# Patient Record
Sex: Male | Born: 1995
Health system: Southern US, Community
[De-identification: ages and names within clinical notes are randomized; demographics above are authoritative.]

## PROBLEM LIST (undated history)

## (undated) DIAGNOSIS — S060XAA Concussion with loss of consciousness status unknown, initial encounter: Secondary | ICD-10-CM

## (undated) DIAGNOSIS — S060X9A Concussion with loss of consciousness of unspecified duration, initial encounter: Secondary | ICD-10-CM

---
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
## 2014-06-19 ENCOUNTER — Ambulatory Visit
Admit: 2014-06-19 | Disposition: A | Discharge status: Home / Self Care | Payer: Self-pay | Attending: Family Medicine | Admitting: Family Medicine

## 2014-08-15 ENCOUNTER — Telehealth: Payer: Self-pay | Admitting: Family Medicine

## 2014-08-15 NOTE — Telephone Encounter (Signed)
No room today. If he is in severe pain may wish to go to Urgent Care tonight

## 2014-08-15 NOTE — Telephone Encounter (Signed)
Just now seeing message please advise

## 2014-08-15 NOTE — Telephone Encounter (Signed)
Pt stated he is still having serve head pain and when he gets hot he sometimes has blurred vision. Pt is scheduled to come in tomorrow but would like to see if he could be worked in today if possible. Thanks TNP

## 2014-08-16 ENCOUNTER — Encounter: Payer: Self-pay | Admitting: Family Medicine

## 2014-08-16 ENCOUNTER — Ambulatory Visit (INDEPENDENT_AMBULATORY_CARE_PROVIDER_SITE_OTHER): Payer: 59 | Admitting: Family Medicine

## 2014-08-16 VITALS — BP 110/70 | HR 60 | Temp 98.0°F | Resp 16 | Ht 69.5 in | Wt 163.0 lb

## 2014-08-16 DIAGNOSIS — G43709 Chronic migraine without aura, not intractable, without status migrainosus: Secondary | ICD-10-CM

## 2014-08-16 DIAGNOSIS — R0789 Other chest pain: Secondary | ICD-10-CM

## 2014-08-16 MED ORDER — ISOMETHEPTENE-DICHLORAL-APAP 65-100-325 MG PO CAPS: 1.0000 | ORAL_CAPSULE | Freq: Four times a day (QID) | ORAL | Status: DC | PRN

## 2014-08-16 MED ORDER — ISOMETHEPTENE-DICHLORAL-APAP 65-100-325 MG PO CAPS
1.0000 | ORAL_CAPSULE | Freq: Four times a day (QID) | ORAL | Status: DC | PRN
Start: 2014-08-16 — End: 2018-02-01

## 2014-08-16 MED ORDER — HYDROCODONE-ACETAMINOPHEN 5-325 MG PO TABS: ORAL_TABLET | ORAL | Status: DC

## 2014-08-16 MED ORDER — METOPROLOL SUCCINATE ER 25 MG PO TB24: 25.0000 mg | ORAL_TABLET | Freq: Every day | ORAL | Status: DC
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
## 2014-08-16 NOTE — Patient Instructions (Addendum)
Be careful of sedation with the medication to stop migraine headaches. Start Aleve two pills daily with food for rib pain.

## 2014-08-16 NOTE — Telephone Encounter (Signed)
Didn't see message until today 08/16/14 and pt is already on today's schedule.

## 2014-09-12 ENCOUNTER — Other Ambulatory Visit: Payer: Self-pay

## 2014-09-12 ENCOUNTER — Ambulatory Visit: Payer: 59 | Admitting: Family Medicine

## 2014-09-12 DIAGNOSIS — G44329 Chronic post-traumatic headache, not intractable: Secondary | ICD-10-CM | POA: Insufficient documentation

## 2014-09-12 DIAGNOSIS — S060X9A Concussion with loss of consciousness of unspecified duration, initial encounter: Secondary | ICD-10-CM | POA: Insufficient documentation

## 2016-07-07 ENCOUNTER — Ambulatory Visit (INDEPENDENT_AMBULATORY_CARE_PROVIDER_SITE_OTHER): Payer: 59 | Admitting: Family Medicine

## 2016-07-07 ENCOUNTER — Encounter: Payer: Self-pay | Admitting: Family Medicine

## 2016-07-07 VITALS — BP 130/70 | HR 75 | Temp 98.2°F | Resp 16 | Wt 191.6 lb

## 2016-07-07 DIAGNOSIS — S39012A Strain of muscle, fascia and tendon of lower back, initial encounter: Secondary | ICD-10-CM | POA: Diagnosis not present

## 2016-07-07 MED ORDER — PREDNISONE 20 MG PO TABS: ORAL_TABLET | ORAL | 0 refills | Status: DC

## 2016-07-07 MED ORDER — CYCLOBENZAPRINE HCL 5 MG PO TABS: 5.0000 mg | ORAL_TABLET | Freq: Three times a day (TID) | ORAL | 0 refills | Status: DC | PRN

## 2016-07-07 NOTE — Patient Instructions (Signed)
No lifting > 10 #. Try to avoid squatting/bending. If not improving over the next two weeks-call for x-ray.

## 2016-07-07 NOTE — Progress Notes (Signed)
Subjective:     Patient ID: Dan Pennington, male   DOB: 01/18/1996, 21 y.o.   MRN: [id number]  HPI  Chief Complaint  Patient presents with  . Back Pain    Patient comes in office today with complaints of lower back pain for the past 3 weeks. Patient recalls 3 weeks ago he was changing tire and when standing up immediatley he had sharp pain in his lower back that radiated down to right leg. Patient states that when pain began he had weakness in his right leg " like it was going to give out." Patient has been taking otc Ibuprofen for relief.   States he quit taking ibuprofen as was not helping. States pain will intermittently radiate from his right lower back down to his mid-calf. Currently self-employed as a Curatormechanic.   Review of Systems     Objective:   Physical Exam  Constitutional: He appears well-developed and well-nourished. Distressed: mild discomfort when changing positions.  Musculoskeletal:  Muscle strength in lower extremities 5/5. SLR's to 60 degrees without radiation of back pain.       Assessment:    1. Low back strain, initial encounter: ? Radicular component. - predniSONE (DELTASONE) 20 MG tablet; Taper as follows: 3 pills for 4 days, two pills for 4 days, one pill for four days  Dispense: 24 tablet; Refill: 0 - cyclobenzaprine (FLEXERIL) 5 MG tablet; Take 1 tablet (5 mg total) by mouth 3 (three) times daily as needed for muscle spasms.  Dispense: 21 tablet; Refill: 0    Plan:    Call for x-ray if not improving over the next two weeks. No lift > 10#, bending or squatting.

## 2018-02-01 ENCOUNTER — Ambulatory Visit (INDEPENDENT_AMBULATORY_CARE_PROVIDER_SITE_OTHER): Payer: 59 | Admitting: Family Medicine

## 2018-02-01 ENCOUNTER — Encounter: Payer: Self-pay | Admitting: Family Medicine

## 2018-02-01 ENCOUNTER — Other Ambulatory Visit: Payer: Self-pay

## 2018-02-01 VITALS — BP 136/88 | HR 68 | Temp 98.1°F | Ht 69.5 in | Wt 189.6 lb

## 2018-02-01 DIAGNOSIS — Z8669 Personal history of other diseases of the nervous system and sense organs: Secondary | ICD-10-CM | POA: Diagnosis not present

## 2018-02-01 DIAGNOSIS — G4452 New daily persistent headache (NDPH): Secondary | ICD-10-CM

## 2018-02-01 DIAGNOSIS — R42 Dizziness and giddiness: Secondary | ICD-10-CM | POA: Diagnosis not present

## 2018-02-01 NOTE — Patient Instructions (Signed)
Update your eye exam. We will call you with the lab results.
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
## 2018-08-29 ENCOUNTER — Encounter (HOSPITAL_COMMUNITY): Payer: Self-pay | Admitting: Emergency Medicine

## 2018-08-29 ENCOUNTER — Emergency Department (HOSPITAL_COMMUNITY): Payer: Self-pay

## 2018-08-29 ENCOUNTER — Other Ambulatory Visit: Payer: Self-pay

## 2018-08-29 ENCOUNTER — Emergency Department (HOSPITAL_COMMUNITY)
Admission: EM | Admit: 2018-08-29 | Discharge: 2018-08-29 | Disposition: A | Discharge status: Home / Self Care | Payer: Self-pay | Attending: Emergency Medicine | Admitting: Emergency Medicine

## 2018-08-29 DIAGNOSIS — Y93I9 Activity, other involving external motion: Secondary | ICD-10-CM | POA: Insufficient documentation

## 2018-08-29 DIAGNOSIS — Y92414 Local residential or business street as the place of occurrence of the external cause: Secondary | ICD-10-CM | POA: Insufficient documentation

## 2018-08-29 DIAGNOSIS — G44319 Acute post-traumatic headache, not intractable: Secondary | ICD-10-CM | POA: Insufficient documentation

## 2018-08-29 DIAGNOSIS — S0081XA Abrasion of other part of head, initial encounter: Secondary | ICD-10-CM | POA: Insufficient documentation

## 2018-08-29 DIAGNOSIS — R0789 Other chest pain: Secondary | ICD-10-CM

## 2018-08-29 DIAGNOSIS — S20211A Contusion of right front wall of thorax, initial encounter: Secondary | ICD-10-CM | POA: Insufficient documentation

## 2018-08-29 DIAGNOSIS — Z789 Other specified health status: Secondary | ICD-10-CM

## 2018-08-29 DIAGNOSIS — S90511A Abrasion, right ankle, initial encounter: Secondary | ICD-10-CM

## 2018-08-29 DIAGNOSIS — Z7289 Other problems related to lifestyle: Secondary | ICD-10-CM

## 2018-08-29 DIAGNOSIS — F17210 Nicotine dependence, cigarettes, uncomplicated: Secondary | ICD-10-CM | POA: Insufficient documentation

## 2018-08-29 DIAGNOSIS — Z23 Encounter for immunization: Secondary | ICD-10-CM | POA: Insufficient documentation

## 2018-08-29 DIAGNOSIS — F10929 Alcohol use, unspecified with intoxication, unspecified: Secondary | ICD-10-CM | POA: Insufficient documentation

## 2018-08-29 DIAGNOSIS — Y999 Unspecified external cause status: Secondary | ICD-10-CM | POA: Insufficient documentation

## 2018-08-29 DIAGNOSIS — S9031XA Contusion of right foot, initial encounter: Secondary | ICD-10-CM | POA: Insufficient documentation

## 2018-08-29 DIAGNOSIS — Y906 Blood alcohol level of 120-199 mg/100 ml: Secondary | ICD-10-CM | POA: Insufficient documentation

## 2018-08-29 DIAGNOSIS — T148XXA Other injury of unspecified body region, initial encounter: Secondary | ICD-10-CM

## 2018-08-29 HISTORY — DX: Concussion with loss of consciousness of unspecified duration, initial encounter: S06.0X9A

## 2018-08-29 HISTORY — DX: Concussion with loss of consciousness status unknown, initial encounter: S06.0XAA

## 2018-08-29 LAB — BASIC METABOLIC PANEL
Anion gap: 12 (ref 5–15)
BUN: 6 mg/dL (ref 6–20)
CO2: 21 mmol/L — ABNORMAL LOW (ref 22–32)
Calcium: 9.2 mg/dL (ref 8.9–10.3)
Chloride: 107 mmol/L (ref 98–111)
Creatinine, Ser: 0.92 mg/dL (ref 0.61–1.24)
GFR calc Af Amer: >60 mL/min (ref >60)
GFR calc non Af Amer: >60 mL/min (ref >60)
Glucose, Bld: 117 mg/dL — ABNORMAL HIGH (ref 70–99)
Potassium: 4.1 mmol/L (ref 3.5–5.1)
Sodium: 140 mmol/L (ref 135–145)

## 2018-08-29 LAB — CBC WITH DIFFERENTIAL/PLATELET
Abs Immature Granulocytes: 0.04 10*3/uL (ref 0.00–0.07)
Basophils Absolute: 0.2 10*3/uL — ABNORMAL HIGH (ref 0.0–0.1)
Basophils Relative: 2 %
Eosinophils Absolute: 0.2 10*3/uL (ref 0.0–0.5)
Eosinophils Relative: 2 %
HCT: 46.2 % (ref 39.0–52.0)
Hemoglobin: 16.5 g/dL (ref 13.0–17.0)
Immature Granulocytes: 1 %
Lymphocytes Relative: 25 %
Lymphs Abs: 2.2 10*3/uL (ref 0.7–4.0)
MCH: 33.1 pg (ref 26.0–34.0)
MCHC: 35.7 g/dL (ref 30.0–36.0)
MCV: 92.6 fL (ref 80.0–100.0)
Monocytes Absolute: 1 10*3/uL (ref 0.1–1.0)
Monocytes Relative: 12 %
Neutro Abs: 5.2 10*3/uL (ref 1.7–7.7)
Neutrophils Relative %: 58 %
Platelets: 325 10*3/uL (ref 150–400)
RBC: 4.99 MIL/uL (ref 4.22–5.81)
RDW: 12.2 % (ref 11.5–15.5)
WBC: 8.7 10*3/uL (ref 4.0–10.5)
nRBC: 0 % (ref 0.0–0.2)

## 2018-08-29 LAB — I-STAT CHEM 8, ED
BUN: 5 mg/dL — ABNORMAL LOW (ref 6–20)
Calcium, Ion: 1.1 mmol/L — ABNORMAL LOW (ref 1.15–1.40)
Chloride: 110 mmol/L (ref 98–111)
Creatinine, Ser: 1.1 mg/dL (ref 0.61–1.24)
Glucose, Bld: 116 mg/dL — ABNORMAL HIGH (ref 70–99)
HCT: 47 % (ref 39.0–52.0)
Hemoglobin: 16 g/dL (ref 13.0–17.0)
Potassium: 3.9 mmol/L (ref 3.5–5.1)
Sodium: 142 mmol/L (ref 135–145)
TCO2: 22 mmol/L (ref 22–32)

## 2018-08-29 LAB — ETHANOL: Alcohol, Ethyl (B): 152 mg/dL — ABNORMAL HIGH (ref <10)

## 2018-08-29 MED ORDER — IOHEXOL 300 MG/ML  SOLN
75.0000 mL | Freq: Once | INTRAMUSCULAR | Status: AC | PRN
  Administered 2018-08-29: 75 mL via INTRAVENOUS

## 2018-08-29 MED ORDER — HYDROMORPHONE HCL 1 MG/ML IJ SOLN
0.5000 mg | Freq: Once | INTRAMUSCULAR | Status: AC
Start: 2018-08-29 — End: 2018-08-29
  Administered 2018-08-29: 0.5 mg via INTRAVENOUS
  Filled 2018-08-29: qty 1

## 2018-08-29 MED ORDER — TETANUS-DIPHTH-ACELL PERTUSSIS 5-2.5-18.5 LF-MCG/0.5 IM SUSP
0.5000 mL | Freq: Once | INTRAMUSCULAR | Status: AC
  Administered 2018-08-29: 0.5 mL via INTRAMUSCULAR
  Filled 2018-08-29: qty 0.5

## 2018-08-29 MED ORDER — IBUPROFEN 600 MG PO TABS: 600.0000 mg | ORAL_TABLET | Freq: Four times a day (QID) | ORAL | 0 refills | Status: AC | PRN

## 2018-08-29 MED ORDER — ACETAMINOPHEN 500 MG PO TABS: 500.0000 mg | ORAL_TABLET | Freq: Four times a day (QID) | ORAL | 0 refills | Status: AC | PRN
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
## 2018-08-29 NOTE — ED Notes (Signed)
Aspen collar was applied. Pt removed it 2 minutes post application.

## 2018-08-29 NOTE — Discharge Instructions (Addendum)
Keep wounds clean and dry.  Can apply topical antibiotic ointment.  Use the incentive spirometer 2-3 times daily to avoid development of a pneumonia.  If you develop a fever cough or shortness of breath please return to the emergency department immediately as this is concerning for lung infection.  I would recommend that you quit smoking.  Alternate 600 mg of ibuprofen and [id number] mg of Tylenol every 3-4 hours as needed for pain. Do not exceed 4000 mg of Tylenol daily.  Take ibuprofen with food to avoid upset stomach issues.  Apply ice or heat, whichever feels best for comfort.  I usually recommend 20 minutes at a time 2-3 times daily.  Take some hot showers and hot baths throughout the day and do some gentle stretching to avoid muscle stiffness.   You may have a concussion, which is head injury without evidence of abnormality on CT scan.  Stay in a dimly lit room with minimal stimuli, reduce screen time (phone, TV) by at least 50% if not more, and avoid contact sports until cleared by a physician.  I have given you the contact information for Dr. Hulan Saas with [facility] who runs a concussion clinic.  You can follow-up with him if your symptoms persist.   I anticipate you will probably feel sore for approximately 1 week.  Do not feel surprised if you wake up tomorrow feeling more sore than you do today. Follow-up with a primary care physician if you are not back to your normal activity levels within 1 week.  Return to the emergency department immediately for any concerning signs or symptoms develop such as altered mental status, persistent vomiting, weakness, severe swelling or redness of a limb, or fevers.

## 2018-08-29 NOTE — ED Notes (Signed)
Patient transported to CT 

## 2018-08-29 NOTE — ED Notes (Signed)
Wound to ankle/foot cleaned with antiseptic and dressed. Pt demonstrated use of incentive spirometer.

## 2020-11-29 ENCOUNTER — Other Ambulatory Visit: Payer: Self-pay

## 2020-11-29 MED ORDER — ACETAMINOPHEN-CODEINE #3 300-30 MG PO TABS
ORAL_TABLET | ORAL | 0 refills | Status: AC
  Filled 2020-11-29: qty 15, 4d supply, fill #0

## 2020-11-29 MED ORDER — AMOXICILLIN 500 MG PO CAPS
ORAL_CAPSULE | ORAL | 0 refills | Status: AC
  Filled 2020-11-29: qty 21, 7d supply, fill #0

## 2020-11-29 MED ORDER — CHLORHEXIDINE GLUCONATE 0.12 % MT SOLN
OROMUCOSAL | 1 refills | Status: AC
  Filled 2020-11-29: qty 473, 14d supply, fill #0

## 2020-11-30 ENCOUNTER — Other Ambulatory Visit: Payer: Self-pay

## 2021-01-23 ENCOUNTER — Other Ambulatory Visit: Payer: Self-pay

## 2021-01-23 MED ORDER — HYDROCODONE-ACETAMINOPHEN 5-325 MG PO TABS
ORAL_TABLET | ORAL | 0 refills | Status: AC
  Filled 2021-01-23: qty 6, 2d supply, fill #0
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
## 2021-11-05 ENCOUNTER — Emergency Department (HOSPITAL_COMMUNITY)
Admission: EM | Admit: 2021-11-05 | Discharge: 2021-11-06 | Disposition: A | Discharge status: Home / Self Care | Payer: Self-pay | Attending: Emergency Medicine | Admitting: Emergency Medicine

## 2021-11-05 DIAGNOSIS — R0602 Shortness of breath: Secondary | ICD-10-CM | POA: Insufficient documentation

## 2021-11-05 DIAGNOSIS — R0789 Other chest pain: Secondary | ICD-10-CM | POA: Insufficient documentation

## 2021-11-06 ENCOUNTER — Emergency Department (HOSPITAL_COMMUNITY): Payer: Self-pay

## 2021-11-06 ENCOUNTER — Encounter (HOSPITAL_COMMUNITY): Payer: Self-pay | Admitting: Emergency Medicine

## 2021-11-06 ENCOUNTER — Other Ambulatory Visit: Payer: Self-pay

## 2021-11-06 LAB — CBC
HCT: 46.4 % (ref 39.0–52.0)
Hemoglobin: 16.2 g/dL (ref 13.0–17.0)
MCH: 32.3 pg (ref 26.0–34.0)
MCHC: 34.9 g/dL (ref 30.0–36.0)
MCV: 92.6 fL (ref 80.0–100.0)
Platelets: 320 10*3/uL (ref 150–400)
RBC: 5.01 MIL/uL (ref 4.22–5.81)
RDW: 12.7 % (ref 11.5–15.5)
WBC: 7.4 10*3/uL (ref 4.0–10.5)
nRBC: 0 % (ref 0.0–0.2)

## 2021-11-06 LAB — BASIC METABOLIC PANEL
Anion gap: 12 (ref 5–15)
BUN: 10 mg/dL (ref 6–20)
CO2: 21 mmol/L — ABNORMAL LOW (ref 22–32)
Calcium: 9.3 mg/dL (ref 8.9–10.3)
Chloride: 105 mmol/L (ref 98–111)
Creatinine, Ser: 1.22 mg/dL (ref 0.61–1.24)
GFR, Estimated: >60 mL/min (ref >60)
Glucose, Bld: 110 mg/dL — ABNORMAL HIGH (ref 70–99)
Potassium: 3 mmol/L — ABNORMAL LOW (ref 3.5–5.1)
Sodium: 138 mmol/L (ref 135–145)

## 2021-11-06 LAB — TROPONIN I (HIGH SENSITIVITY)
Troponin I (High Sensitivity): 4 ng/L (ref <18)
Troponin I (High Sensitivity): 4 ng/L (ref <18)

## 2021-11-06 LAB — D-DIMER, QUANTITATIVE: D-Dimer, Quant: <0.27 ug/mL-FEU (ref 0.00–0.50)

## 2021-11-06 MED ORDER — IBUPROFEN 800 MG PO TABS
800.0000 mg | ORAL_TABLET | Freq: Once | ORAL | Status: AC
  Administered 2021-11-06: 800 mg via ORAL
  Filled 2021-11-06: qty 1

## 2021-11-06 MED ORDER — OXYCODONE-ACETAMINOPHEN 5-325 MG PO TABS
1.0000 | ORAL_TABLET | Freq: Once | ORAL | Status: AC
  Administered 2021-11-06: 1 via ORAL
  Filled 2021-11-06: qty 1

## 2021-11-06 NOTE — ED Provider Notes (Signed)
[facility] EMERGENCY DEPARTMENT Provider Note   CSN: [id number] Arrival date & time: 11/05/21  2355     History  Chief Complaint  Patient presents with   Chest Pain    Dan Pennington. is a 26 y.o. male.  Patient presents to the emergency department for evaluation of chest pain.  Patient developed pain in the left side of his chest that radiates into the arm.  Symptoms began around 8 PM.  He reports that it hurts to talk, breathe and move.  Denies injury.  He has mild shortness of breath associated with the symptoms.  He is concerned because his grandparents had heart attacks.  No first-degree relatives with CAD.       Home Medications Prior to Admission medications   Medication Sig Start Date End Date Taking? Authorizing Provider  acetaminophen (TYLENOL) 500 MG tablet Take 1 tablet (500 mg total) by mouth every 6 (six) hours as needed. 08/29/18   Luevenia Maxin, Mina A, PA-C  acetaminophen-codeine (TYLENOL #3) 300-30 MG tablet Take 1 tablet by mouth every 6 to 8 hours as needed for pain 11/29/20     amoxicillin (AMOXIL) 500 MG capsule Take one capsule by mouth every 8 hours for 7 days 11/29/20     chlorhexidine (PERIDEX) 0.12 % solution Rinse with 1/2 capful twice a day (morning and evening) for 30 seconds after brushing teeth 11/29/20     HYDROcodone-acetaminophen (NORCO/VICODIN) 5-325 MG tablet Take 1 tablet by mouth every four (4) hours as needed for pain for up to 6 doses. 01/23/21     ibuprofen (ADVIL) 600 MG tablet Take 1 tablet (600 mg total) by mouth every 6 (six) hours as needed. 08/29/18   Luevenia Maxin, Mina A, PA-C  naproxen (NAPROSYN) 500 MG tablet Take by mouth. 06/23/14   [provider]      Allergies    Patient has no known allergies.    Review of Systems   Review of Systems  Physical Exam Updated Vital Signs BP (!) 144/71   Pulse 71   Temp 98.2 F (36.8 C) (Oral)   Resp 17   SpO2 96%  Physical Exam Vitals and nursing note reviewed.   Constitutional:      General: He is not in acute distress.    Appearance: He is well-developed.  HENT:     Head: Normocephalic and atraumatic.     Mouth/Throat:     Mouth: Mucous membranes are moist.  Eyes:     General: Vision grossly intact. Gaze aligned appropriately.     Extraocular Movements: Extraocular movements intact.     Conjunctiva/sclera: Conjunctivae normal.  Cardiovascular:     Rate and Rhythm: Normal rate and regular rhythm.     Pulses: Normal pulses.     Heart sounds: Normal heart sounds, S1 normal and S2 normal. No murmur heard.    No friction rub. No gallop.  Pulmonary:     Effort: Pulmonary effort is normal. No respiratory distress.     Breath sounds: Normal breath sounds.  Chest:     Chest wall: Tenderness present.    Abdominal:     Palpations: Abdomen is soft.     Tenderness: There is no abdominal tenderness. There is no guarding or rebound.     Hernia: No hernia is present.  Musculoskeletal:        General: No swelling.     Cervical back: Full passive range of motion without pain, normal range of motion and neck supple. No  pain with movement, spinous process tenderness or muscular tenderness. Normal range of motion.     Right lower leg: No edema.     Left lower leg: No edema.  Skin:    General: Skin is warm and dry.     Capillary Refill: Capillary refill takes less than 2 seconds.     Findings: No ecchymosis, erythema, lesion or wound.  Neurological:     Mental Status: He is alert and oriented to person, place, and time.     GCS: GCS eye subscore is 4. GCS verbal subscore is 5. GCS motor subscore is 6.     Cranial Nerves: Cranial nerves 2-12 are intact.     Sensory: Sensation is intact.     Motor: Motor function is intact. No weakness or abnormal muscle tone.     Coordination: Coordination is intact.  Psychiatric:        Mood and Affect: Mood normal.        Speech: Speech normal.        Behavior: Behavior normal.     ED Results / Procedures /  Treatments   Labs (all labs ordered are listed, but only abnormal results are displayed) Labs Reviewed  BASIC METABOLIC PANEL - Abnormal; Notable for the following components:      Result Value   Potassium 3.0 (*)    CO2 21 (*)    Glucose, Bld 110 (*)    All other components within normal limits  CBC  D-DIMER, QUANTITATIVE  TROPONIN I (HIGH SENSITIVITY)  TROPONIN I (HIGH SENSITIVITY)    EKG EKG Interpretation  Date/Time:  Tuesday November 05 2021 23:57:58 EDT Ventricular Rate:  76 PR Interval:  138 QRS Duration: 88 QT Interval:  350 QTC Calculation: 393 R Axis:   99 Text Interpretation: Normal sinus rhythm Rightward axis Borderline ECG No previous ECGs available Confirmed by Gilda Crease [phone]) on 11/06/2021 12:41:36 AM  Radiology DG Chest 2 View  Result Date: 11/06/2021 CLINICAL DATA:  Chest pain EXAM: CHEST - 2 VIEW COMPARISON:  CT chest 08/29/2018 FINDINGS: No focal consolidation, pleural effusion, or pneumothorax. Normal cardiomediastinal silhouette. No acute osseous abnormality. IMPRESSION: No active cardiopulmonary disease. Electronically Signed   By: Minerva Fester M.D.   On: 11/06/2021 00:21    Procedures Procedures    Medications Ordered in ED Medications  oxyCODONE-acetaminophen (PERCOCET/ROXICET) 5-325 MG per tablet 1 tablet (1 tablet Oral Given 11/06/21 0059)  ibuprofen (ADVIL) tablet 800 mg (800 mg Oral Given 11/06/21 0059)    ED Course/ Medical Decision Making/ A&P                           Medical Decision Making Amount and/or Complexity of Data Reviewed Labs: ordered. Radiology: ordered.  Risk Prescription drug management.   Patient presents to the emergency department for evaluation of chest pain.  Patient concerned because of family history and second and third-degree relatives.  He is hypertensive at arrival but no history of same.  No other cardiac risk factors.  Symptoms atypical, present at rest.  Not related to exertion, pain is  reproducible with movement and palpation.  Low likelihood for acute coronary syndrome.  EKG without obvious ischemia, no ST elevations.  Troponin negative x2.  No associated tachycardia, tachypnea, hypoxia or shortness of breath.  D-dimer negative.  No further work-up necessary to rule out PE.        Final Clinical Impression(s) / ED Diagnoses Final diagnoses:  Chest wall  pain    Rx / DC Orders ED Discharge Orders     None         Alisha Burgo, Canary Brim, MD 11/06/21 [phone]

## 2021-11-06 NOTE — ED Triage Notes (Signed)
Pt c/o chest pain that radiates to his left arm with numbness, and new HTN. Denies shortness of breath, reports family hx heart disease.

## 2021-11-13 ENCOUNTER — Other Ambulatory Visit: Payer: Self-pay

## 2021-11-13 MED ORDER — NAPROXEN 500 MG PO TABS
ORAL_TABLET | ORAL | 1 refills | Status: AC
  Filled 2021-11-13: qty 60, 30d supply, fill #0
